# Patient Record
Sex: Female | Born: 1995 | Race: Black or African American | Hispanic: No | Marital: Single | Smoking: Never smoker
Health system: Southern US, Community
[De-identification: ages and names within clinical notes are randomized; demographics above are authoritative.]

## PROBLEM LIST (undated history)

## (undated) HISTORY — PX: APPENDECTOMY: SHX54

---
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
## 2017-02-17 DIAGNOSIS — Z3041 Encounter for surveillance of contraceptive pills: Secondary | ICD-10-CM | POA: Diagnosis not present

## 2017-03-22 DIAGNOSIS — J1189 Influenza due to unidentified influenza virus with other manifestations: Secondary | ICD-10-CM | POA: Diagnosis not present

## 2017-04-10 IMAGING — US US ABDOMEN LIMITED
1 series · 14 of 25 positions shown · non-contrast
Comparison: None in PACs

CLINICAL DATA: Epigastric abdominal pain suspicion of gallbladder
disease.

EXAM:
US ABDOMEN LIMITED - RIGHT UPPER QUADRANT

[Series 1: us abdomen limited · 0.32mm/px · 14 of 60 slices shown]
[im 1/60]
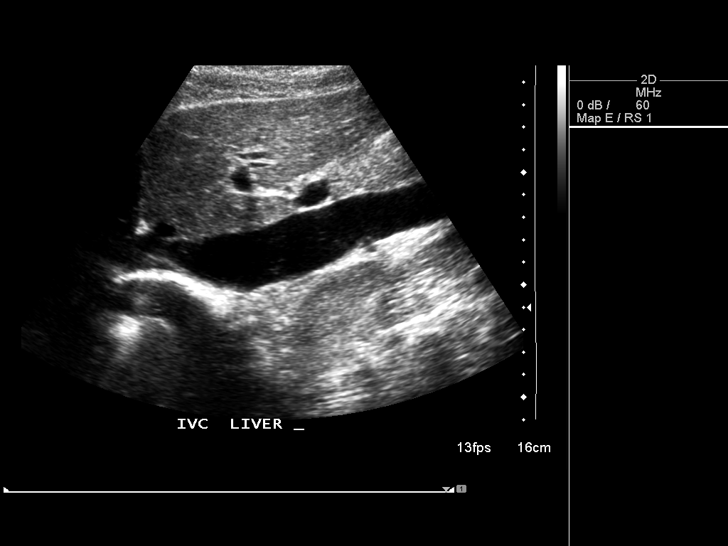
[im 5/60]
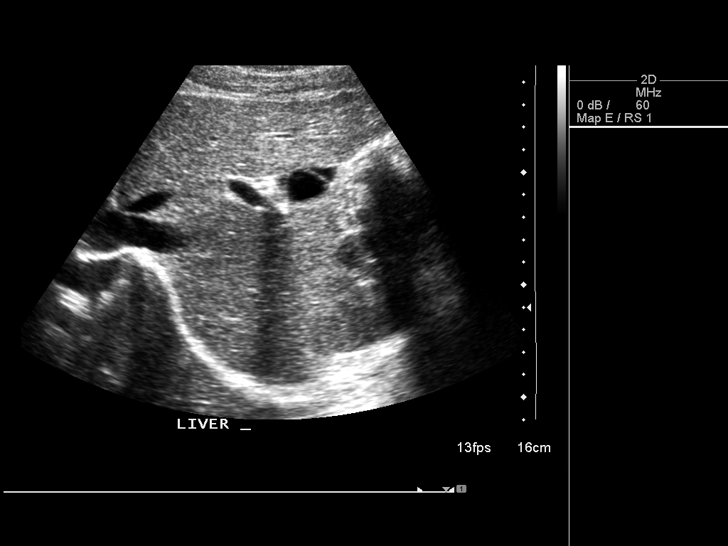
[im 10/60]
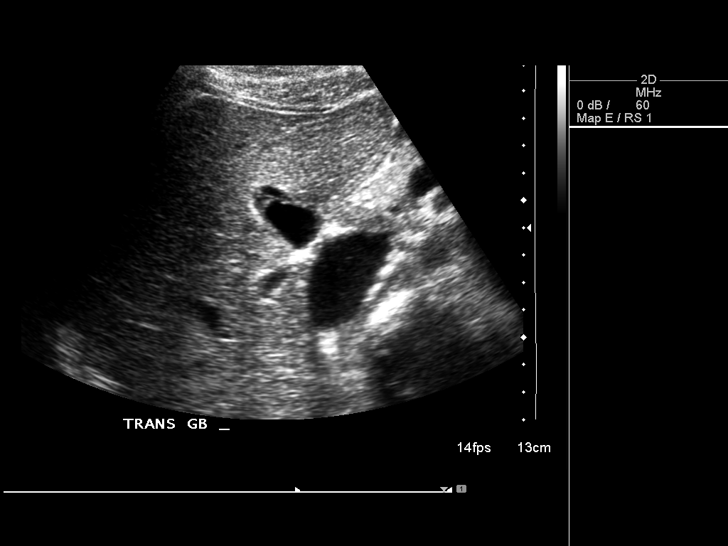
[im 15/60]
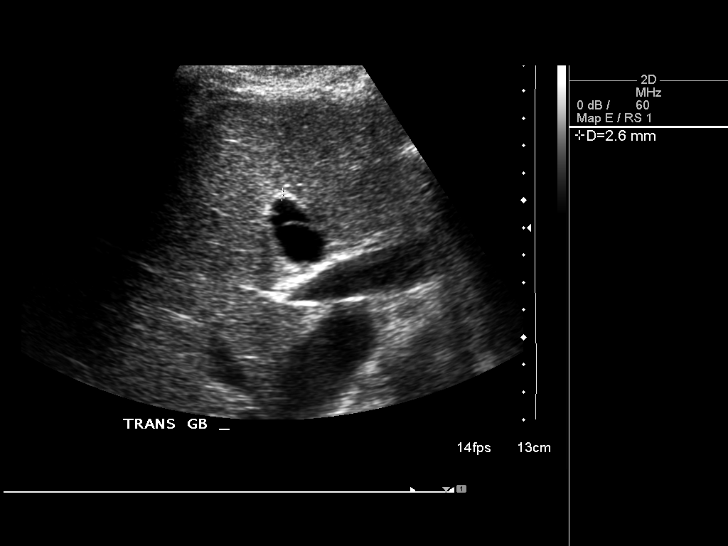
[im 20/60]
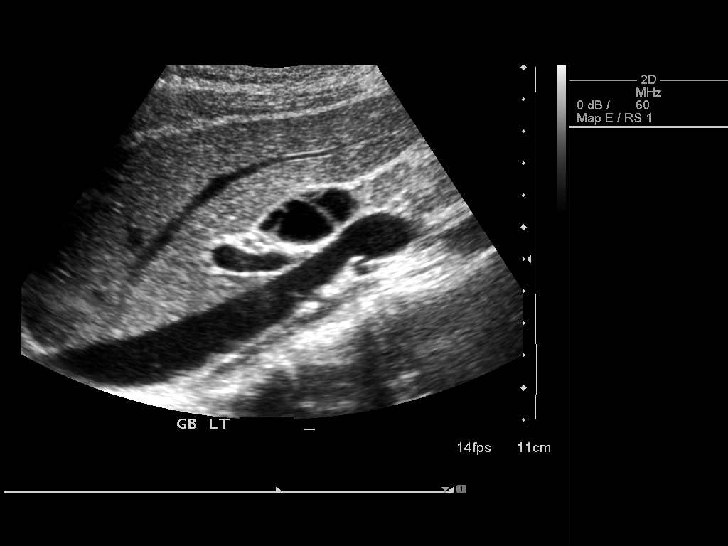
[im 23/60]
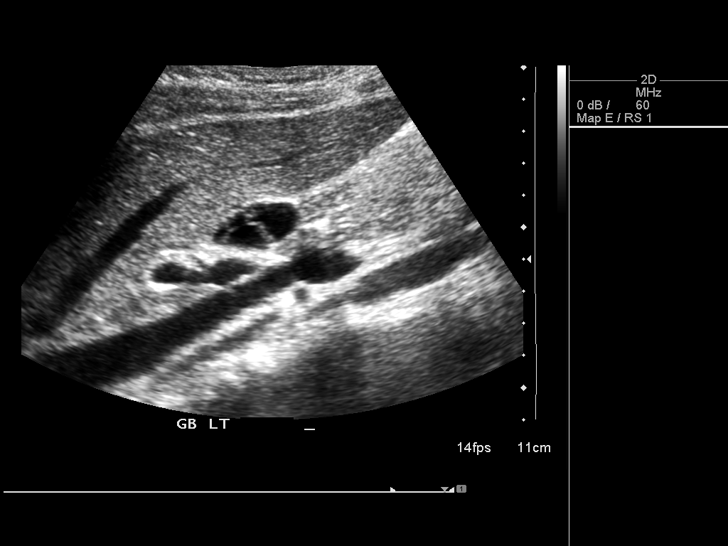
[im 28/60]
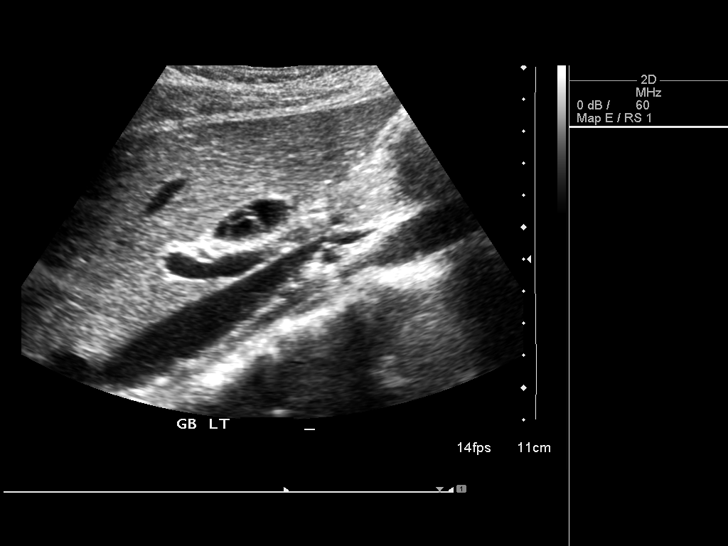
[im 32/60]
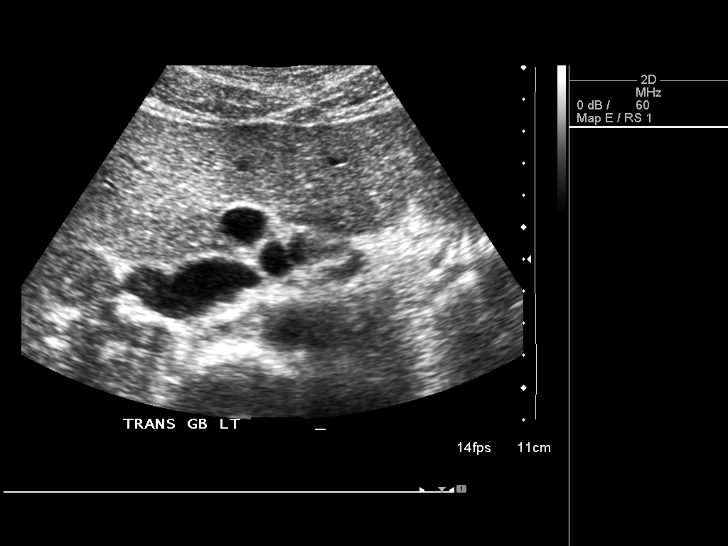
[im 37/60]
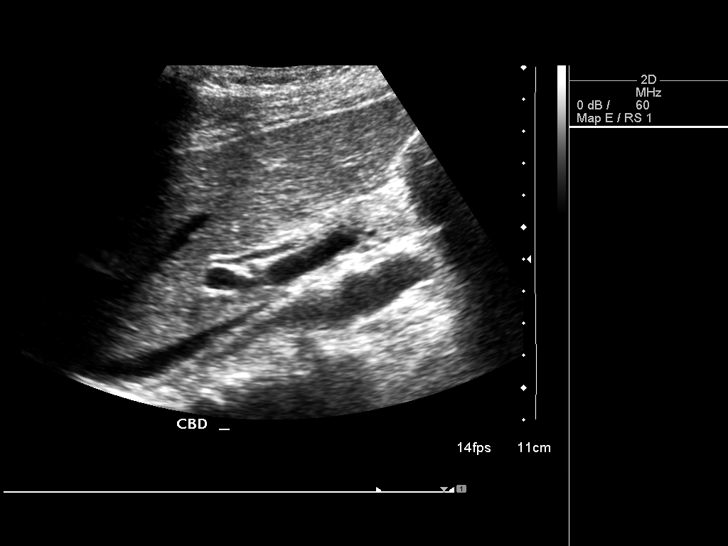
[im 40/60]
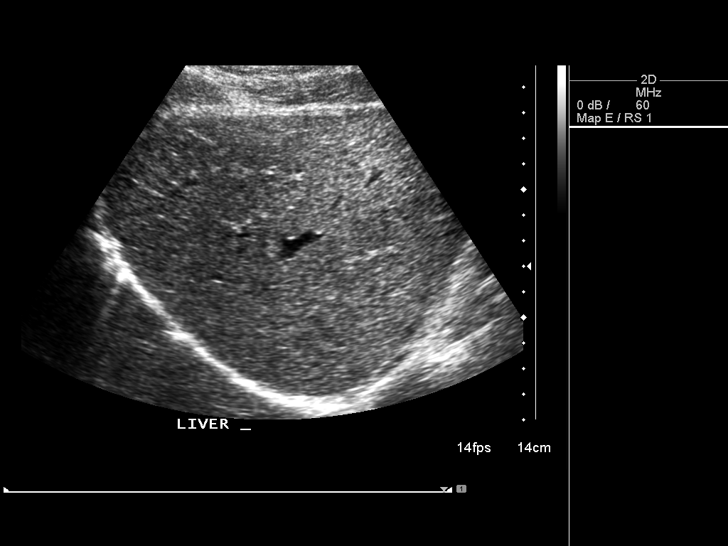
[im 45/60]
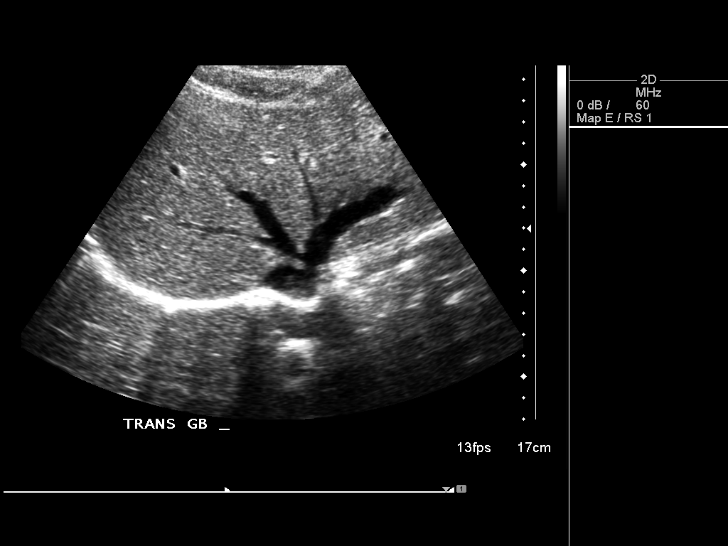
[im 50/60]
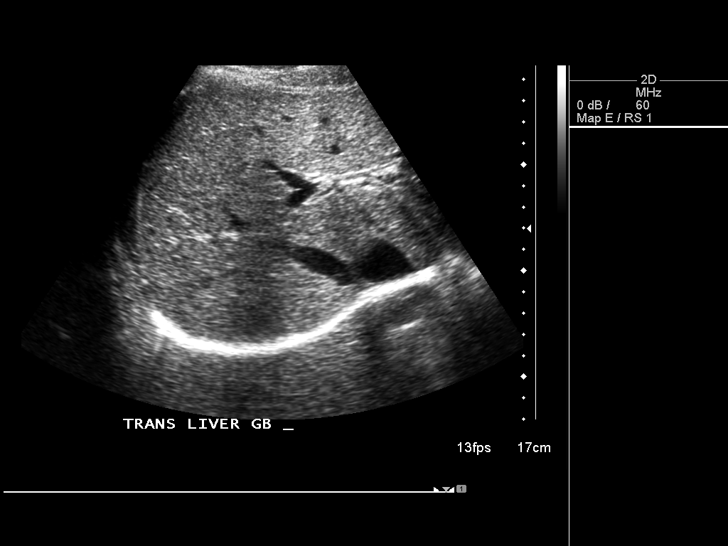
[im 55/60]
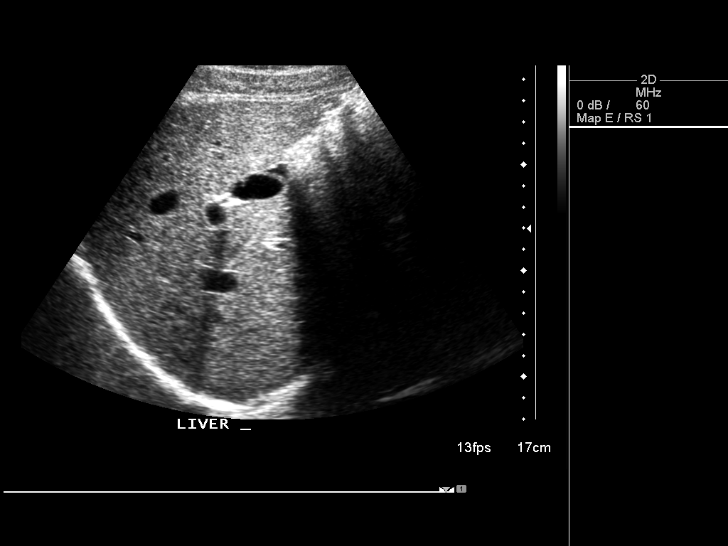
[im 60/60]
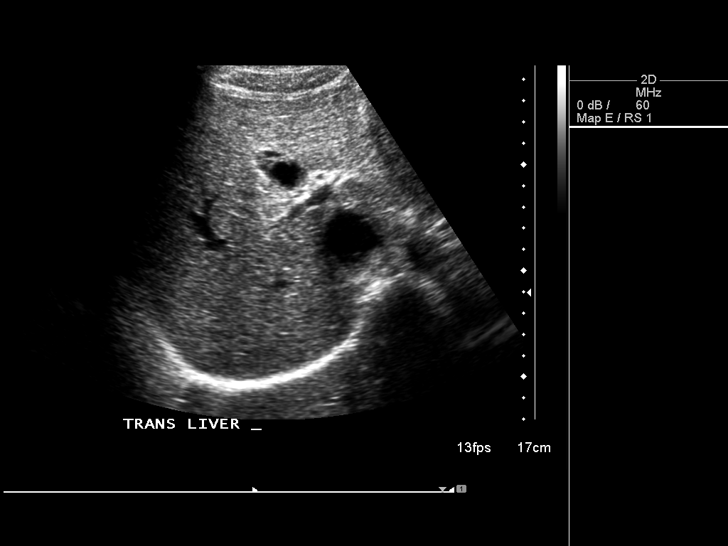

[14 of 25 positions shown; findings below may reference images not displayed]

FINDINGS: Gallbladder:

The gallbladder is adequately distended and contains multiple
septations. No stones or polyps are observed. There is no
gallbladder wall thickening, pericholecystic fluid, or positive
sonographic Murphy's sign.

Common bile duct:

Diameter: 3.1 mm

Liver:

The hepatic echotexture is normal. There is no focal mass nor ductal
dilation.
IMPRESSION: Multi septated gallbladder which can be symptomatic. No gallstones
or sonographic evidence of acute cholecystitis.

Normal appearance of the common bile duct and liver.

## 2017-07-02 IMAGING — NM NM HEPATO W/GB/PHARM/[PERSON_NAME]
1 series · 12 of 12 positions shown · non-contrast
Comparison: Ultrasound abdomen 08/28/2015

CLINICAL DATA: Abdominal pain, epigastric

EXAM:
NUCLEAR MEDICINE HEPATOBILIARY IMAGING WITH GALLBLADDER EF
TECHNIQUE: Sequential images of the abdomen were obtained [DATE] minutes
following intravenous administration of radiopharmaceutical. After
slow intravenous infusion of 1.27 micrograms Cholecystokinin,
gallbladder ejection fraction was determined.
RADIOPHARMACEUTICALS:  5.2 mCi Sc-HHm Choletec IV

[Series 1: hepato · 4.46mm/px · 2 acquisitions, 12 frames shown]
[im 1/2]
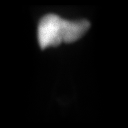
[im 1/2]
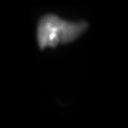
[im 1/2]
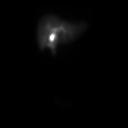
[im 1/2]
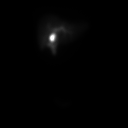
[im 1/2]
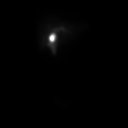
[im 1/2]
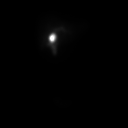
[im 2/2]
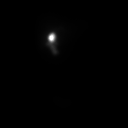
[im 2/2]
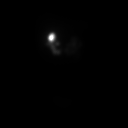
[im 2/2]
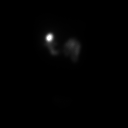
[im 2/2]
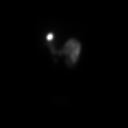
[im 2/2]
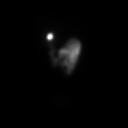
[im 2/2]
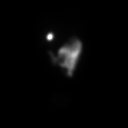

[12 of 12 positions shown; findings below may reference images not displayed]

FINDINGS: Prompt uptake and biliary excretion of activity by the liver is
seen. Gallbladder activity is visualized, consistent with patency of
cystic duct. Biliary activity passes into small bowel, consistent
with patent common bile duct.

Calculated gallbladder ejection fraction is 75.8%. (At 60 min,
normal ejection fraction is greater than 40%.)
IMPRESSION: Negative examination

## 2018-04-09 DIAGNOSIS — Z3041 Encounter for surveillance of contraceptive pills: Secondary | ICD-10-CM | POA: Diagnosis not present

## 2019-01-28 ENCOUNTER — Other Ambulatory Visit: Payer: Self-pay | Admitting: Family Medicine

## 2019-01-28 ENCOUNTER — Other Ambulatory Visit (HOSPITAL_COMMUNITY)
Admission: RE | Admit: 2019-01-28 | Discharge: 2019-01-28 | Disposition: A | Discharge status: Home / Self Care | Payer: 59 | Source: Ambulatory Visit | Attending: Family Medicine | Admitting: Family Medicine

## 2019-01-28 DIAGNOSIS — Z01411 Encounter for gynecological examination (general) (routine) with abnormal findings: Secondary | ICD-10-CM | POA: Diagnosis present

## 2019-02-05 LAB — CYTOLOGY - PAP
Chlamydia: NEGATIVE
Comment: NEGATIVE
Comment: NEGATIVE
Comment: NORMAL
High risk HPV: NEGATIVE
Neisseria Gonorrhea: NEGATIVE

## 2022-08-07 ENCOUNTER — Inpatient Hospital Stay
Admit: 2022-08-07 | Discharge: 2022-08-07 | Disposition: A | Discharge status: Home / Self Care | Payer: BLUE CROSS/BLUE SHIELD | Attending: Emergency Medicine

## 2022-08-07 DIAGNOSIS — F909 Attention-deficit hyperactivity disorder, unspecified type: Secondary | ICD-10-CM

## 2022-08-07 DIAGNOSIS — Z79899 Other long term (current) drug therapy: Secondary | ICD-10-CM

## 2022-08-07 MED ORDER — AMPHETAMINE-DEXTROAMPHETAMINE 10 MG PO TABS
10 MG | ORAL_TABLET | Freq: Every day | ORAL | 0 refills | Status: AC
Start: 2022-08-07 — End: 2022-09-06

## 2022-08-07 MED ORDER — AMPHETAMINE-DEXTROAMPHETAMINE 15 MG PO TABS
15 MG | ORAL_TABLET | Freq: Every day | ORAL | 0 refills | Status: DC
Start: 2022-08-07 — End: 2022-08-07

## 2022-08-07 MED ORDER — AMPHETAMINE-DEXTROAMPHET ER 15 MG PO CP24
15 MG | ORAL_CAPSULE | Freq: Every morning | ORAL | 0 refills | Status: AC
Start: 2022-08-07 — End: 2022-09-06

## 2022-08-07 NOTE — ED Provider Notes (Signed)
Emergency Department Provider Note       PCP: No primary care provider on file.   Age: 27 y.o.   Sex: female     DISPOSITION Decision To Discharge 08/07/2022 01:54:01 PM       ICD-10-CM    1. Medication management  Z79.899 amphetamine-dextroamphetamine (ADDERALL, 15MG ,) 15 MG tablet     amphetamine-dextroamphetamine (ADDERALL, 10MG ,) 10 MG tablet      2. Attention deficit hyperactivity disorder (ADHD), unspecified ADHD type  F90.9 amphetamine-dextroamphetamine (ADDERALL, 15MG ,) 15 MG tablet     amphetamine-dextroamphetamine (ADDERALL, 10MG ,) 10 MG tablet          Medical Decision Making     Review of patient's old records she has been consistently on the same dose of medicine for quite some time she was provided with prescriptions for the same which bridged her up until present.  She is now out of the care of her [place] Dr. And is pending a scheduled appointment in early August.  Will provide her with a 30-day bridging prescription that she was aware is a one-time event.  Documentation from her [facility] doctor had her dose is 15 mg and 10 mg though she is actually on extended relief 15 mg tablets and 10 mg regular.  She came back and we destroyed the original 15 mg prescription and provided her with 30-day supply of 15 mg extended relief     1 acute, uncomplicated illness or injury.  Prescription drug management performed.    I independently ordered and reviewed each unique test.  I reviewed external records: provider visit note from PCP.                   History     Patient comes in for refill of her Adderall which is a long-term medication.  I have reviewed her chart and she is been on this medication for long-term when she was a resident in [place] having just relocated to [place].  At that time she was under the care of a Dr. Excell Seltzer.  Patient was given a 30-day supply to bridge her through her transition and has been unable to get a family provider appointment closer than  August.    The history is provided by the patient.     Physical Exam     Vitals signs and nursing note reviewed:  Vitals:    08/07/22 1328   BP: 132/86   Pulse: 72   Resp: 16   Temp: 98.6 F (37 C)   TempSrc: Oral   SpO2: 100%      Physical Exam  Constitutional:       General: She is not in acute distress.     Appearance: Normal appearance. She is not ill-appearing, toxic-appearing or diaphoretic.      Comments: Cooperative  Well-groomed with no worrisome indices   HENT:      Mouth/Throat:      Mouth: Mucous membranes are moist.   Eyes:      General: No scleral icterus.  Cardiovascular:      Rate and Rhythm: Normal rate and regular rhythm.   Pulmonary:      Effort: Pulmonary effort is normal.   Neurological:      General: No focal deficit present.      Mental Status: She is alert. Mental status is at baseline.   Psychiatric:         Attention and Perception: Attention and perception normal.  Mood and Affect: Mood normal.         Speech: Speech normal. Speech is not rapid and pressured, delayed or slurred.         Behavior: Behavior normal.         Thought Content: Thought content normal.        Procedures     Procedures    No orders of the defined types were placed in this encounter.       Medications given during this emergency department visit:  Medications - No data to display    New Prescriptions    AMPHETAMINE-DEXTROAMPHETAMINE (ADDERALL, 10MG ,) 10 MG TABLET    Take 1 tablet by mouth daily for 30 days. Max Daily Amount: 10 mg    AMPHETAMINE-DEXTROAMPHETAMINE (ADDERALL, 15MG ,) 15 MG TABLET    Take 1 tablet by mouth daily for 30 days. Morning dose Max Daily Amount: 15 mg        No past medical history on file.     No past surgical history on file.     Social History     Socioeconomic History    Marital status: Single        Previous Medications    No medications on file        No results found for any visits on 08/07/22.      No orders to display                No results for input(s): "COVID19" in the  last 72 hours.     Voice dictation software was used during the making of this note.  This software is not perfect and grammatical and other typographical errors may be present.  This note has not been completely proofread for errors.     Hal Morales, MD  08/07/22 [phone]

## 2022-08-07 NOTE — Discharge Instructions (Signed)
We have done a one-time refill on your long-term medications  Referral to family practice on-call today

## 2022-08-07 NOTE — ED Triage Notes (Signed)
Pt arrives to the ER with c/o needing medication refill.

## 2022-08-07 NOTE — ED Notes (Signed)
Patient mobility status  with no difficulty. Provider aware     I have reviewed discharge instructions with the patient.  The patient verbalized understanding.    Patient left ED via Discharge Method: ambulatory to Home with  self .    Opportunity for questions and clarification provided.     Patient given 2 scripts.            Rhonda Schmidt, [place]  08/07/22 [phone]

## 2022-08-08 NOTE — Telephone Encounter (Addendum)
Patient called and informed via vm that August is the earliest that we can get with new patient's.  The medication refill is for a controlled med and we would have to see her.  Suggested UC, ER or previous provider to fill medication.

## 2022-08-08 NOTE — Telephone Encounter (Signed)
-----   Message from [facility] sent at 08/08/2022 12:13 PM EDT -----  Regarding: [facility] Appointment Request  [facility] Appointment Request    Patient needs appointment for [facility] Appointment Type: Existing Condition Follow Up.    Patient Requested Dates(s): Earlier than September 12, 2022  Patient Requested Time: most preferred time is morning but afternoon appointment will do  Provider Name: Feliciana Rossetti, DO    Reason for Appointment Request: Established Patient - No appointments available during search  --------------------------------------------------------------------------------------------------------------------------    Relationship to Patient: Self     Call Back Information: OK to leave message on voicemail  Preferred Call Back Number: Phone [phone]     The patient is requesting to reschedule her appointment as soon as possible because her medication will last on the 30th of June and she is advice to be seen by her provider before she will be having her refill.

## 2022-09-12 NOTE — Progress Notes (Unsigned)
Rhonda Schmidt (07/01/1995) presents today for No chief complaint on file.      There is no problem list on file for this patient.      Allergies   Allergen Reactions    Sulfa Antibiotics        No past surgical history on file.    No family history on file.    Current Outpatient Medications   Medication Sig Dispense Refill    amphetamine-dextroamphetamine (ADDERALL, 10MG ,) 10 MG tablet Take 1 tablet by mouth daily for 30 days. Max Daily Amount: 10 mg 30 tablet 0    amphetamine-dextroamphetamine (ADDERALL XR) 15 MG extended release capsule Take 1 capsule by mouth every morning for 30 days. Max Daily Amount: 15 mg 30 capsule 0     No current facility-administered medications for this visit.       There were no vitals taken for this visit.    Review of Systems   Constitutional:  Negative for appetite change, chills and fever.   HENT:  Negative for sinus pressure, sinus pain and sore throat.    Eyes:  Negative for discharge.   Respiratory:  Negative for chest tightness, shortness of breath and wheezing.    Cardiovascular:  Negative for chest pain and palpitations.   Gastrointestinal:  Negative for abdominal pain, blood in stool, diarrhea, nausea and vomiting.   Endocrine: Negative for cold intolerance and heat intolerance.   Musculoskeletal:  Negative for gait problem.   Skin:  Negative for rash.   Neurological:  Negative for seizures, syncope, weakness and headaches.   Psychiatric/Behavioral:  Negative for behavioral problems, hallucinations and suicidal ideas.         Physical Exam  Constitutional:       Appearance: She is not ill-appearing or toxic-appearing.   HENT:      Head: Normocephalic and atraumatic.      Right Ear: External ear normal.      Left Ear: External ear normal.      Nose: Nose normal.      Mouth/Throat:      Pharynx: No oropharyngeal exudate or posterior oropharyngeal erythema.   Eyes:      General:         Right eye: No discharge.         Left eye: No discharge.      Extraocular Movements:  Extraocular movements intact.      Pupils: Pupils are equal, round, and reactive to light.   Cardiovascular:      Rate and Rhythm: Normal rate and regular rhythm.      Heart sounds: No murmur heard.  Pulmonary:      Effort: Pulmonary effort is normal.      Breath sounds: Normal breath sounds. No stridor. No wheezing.   Abdominal:      Palpations: Abdomen is soft.      Tenderness: There is no abdominal tenderness.      Hernia: No hernia is present.   Musculoskeletal:         General: No deformity. Normal range of motion.      Cervical back: Normal range of motion.      Right lower leg: No edema.      Left lower leg: No edema.   Skin:     General: Skin is warm and dry.      Coloration: Skin is not jaundiced.      Findings: No rash.   Neurological:      General: No focal deficit present.  Mental Status: She is oriented to person, place, and time. Mental status is at baseline.      Motor: No weakness.      Gait: Gait normal.   Psychiatric:         Mood and Affect: Mood normal.         Behavior: Behavior normal.         Thought Content: Thought content normal.         Judgment: Judgment normal.         Assessment/Plan:    1. Attention deficit hyperactivity disorder (ADHD), unspecified ADHD type  2. Encounter to establish care with new doctor     27 year old female new to myself presents to clinic on 09/12/2018 for to establish care with new PCP.  Chart review indicates patient was previously following with;    Dr. Guernsey Campbell, MD   [facility]  [address]  [address]   [place], [place] [address]   [phone] (Work)   [phone] (Fax)     Chart review indicates patient is receiving Adderall 10 mg tablets as well as Adderall extended release 15 mg capsules for management of ADHD.  Most recently prescribed on 08/25/2022 and filled on 09/08/2022 for 30 day supply.  PDMP consulted revealing most recent prescriber to be;    Dr. Bosie Helper, DO  [facility]  [address]  [place] [address]    ===========    Most recent laboratory studies available for review are greater than 86-year-old dated 02/16/2021.  Prior labs include CBC within normal limits, CMP within normal limits including fasting glucose without impairment, creatinine 0.65 with GFR greater than 90.    Only known drug allergy stated to be sulfa antibiotics.  ===========    Patient presents to myself in clinic for the first time on 09/12/2022.    PDMP indicates compliance with current pharmacotherapy and prior prescriptions.      No follow-ups on file.     Rhonda Schmidt, [place]
